# Patient Record
Sex: Male | Born: 1970 | Race: White | Hispanic: No | Marital: Single | Smoking: Current every day smoker
Health system: Southern US, Community
[De-identification: ages and names within clinical notes are randomized; demographics above are authoritative.]

---

## 2019-01-24 ENCOUNTER — Other Ambulatory Visit: Payer: Self-pay

## 2019-01-24 ENCOUNTER — Emergency Department (HOSPITAL_COMMUNITY)
Admission: EM | Admit: 2019-01-24 | Discharge: 2019-01-24 | Disposition: A | Discharge status: Home / Self Care | Payer: Self-pay | Attending: Emergency Medicine | Admitting: Emergency Medicine

## 2019-01-24 DIAGNOSIS — K4090 Unilateral inguinal hernia, without obstruction or gangrene, not specified as recurrent: Secondary | ICD-10-CM | POA: Insufficient documentation

## 2019-01-24 DIAGNOSIS — F1210 Cannabis abuse, uncomplicated: Secondary | ICD-10-CM | POA: Insufficient documentation

## 2019-01-24 NOTE — ED Triage Notes (Addendum)
Pt here with c/o of hernia that was diagnosed two years ago but never sought treatment. Per pt "pain got worse in my ball sac when I jumped off of a step"

## 2019-01-24 NOTE — ED Provider Notes (Addendum)
[facility] EMERGENCY DEPARTMENT Provider Note   CSN: [id number] Arrival date & time: 01/24/19  1640     History   Chief Complaint No chief complaint on file.   HPI Joseph Warren is a 48 y.o. male.     Patient reports his F ing ball sack hurts and it needs to be f ing fixed.   Pt difficult to arouse initially and is angry when he is awoken.  Pt admits to using dope earlier and states he is tired and needs somewhere to piss.  Pt denies over dose.  He denies difficulty breathing.  Pt reports he has had a hernia for years and it needs to be fixed.   The history is provided by the patient. No language interpreter was used.    No past medical history on file.  There are no active problems to display for this patient.         Home Medications    Prior to Admission medications   Not on File    Family History No family history on file.  Social History Social History   Tobacco Use  . Smoking status: Not on file  Substance Use Topics  . Alcohol use: Not on file  . Drug use: Not on file     Allergies   Patient has no allergy information on record.   Review of Systems Review of Systems  Unable to perform ROS: Other  cursing and uncooperative    Physical Exam Updated Vital Signs BP 118/82 (BP Location: Right Arm)   Pulse 80   Temp 98.1 F (36.7 C) (Oral)   Resp 18   SpO2 98%   Physical Exam Vitals signs and nursing note reviewed.  Constitutional:      Appearance: He is well-developed.  HENT:     Head: Normocephalic.  Neck:     Musculoskeletal: Normal range of motion.  Cardiovascular:     Rate and Rhythm: Normal rate.  Pulmonary:     Effort: Pulmonary effort is normal.  Abdominal:     General: Abdomen is flat. There is no distension.     Comments: Easily reducible hernia right inguinal area   Musculoskeletal: Normal range of motion.  Neurological:     Mental Status: He is alert and oriented to person, place, and time.   Psychiatric:     Comments: Angry/abusive       ED Treatments / Results  Labs (all labs ordered are listed, but only abnormal results are displayed) Labs Reviewed - No data to display  EKG None  Radiology No results found.  Procedures Procedures (including critical care time)  Medications Ordered in ED Medications - No data to display   Initial Impression / Assessment and Plan / ED Course  I have reviewed the triage vital signs and the nursing notes.  Pertinent labs & imaging results that were available during my care of the patient were reviewed by me and considered in my medical decision making (see chart for details).        Pt assessed with EMT in the room.  Pt is able to stand and ambulate.  His vital signs are normal.  Pt does not appear to have any acute emergent issues. He does not want tylenol. He reports he gets better drugs off the street. Pt very unpleasant and abusive with staff.  Pt is discharged with a referal to [place] [facility].   Final Clinical Impressions(s) / ED Diagnoses   Final diagnoses:  Hernia, inguinal, right    ED Discharge Orders    None    An After Visit Summary was printed and given to the patient.    Elson Areas, PA-C 01/24/19 1810    Elson Areas, PA-C 01/24/19 [name], MD 01/24/19 1956

## 2019-01-24 NOTE — ED Notes (Signed)
Patient verbalizes understanding of discharge instructions. Opportunity for questioning and answers were provided. Armband removed by staff, pt discharged from ED.  

## 2019-02-11 ENCOUNTER — Emergency Department (HOSPITAL_COMMUNITY)
Admission: EM | Admit: 2019-02-11 | Discharge: 2019-02-11 | Disposition: A | Discharge status: Home / Self Care | Payer: Self-pay | Attending: Emergency Medicine | Admitting: Emergency Medicine

## 2019-02-11 ENCOUNTER — Emergency Department (HOSPITAL_COMMUNITY): Payer: Self-pay

## 2019-02-11 ENCOUNTER — Other Ambulatory Visit: Payer: Self-pay

## 2019-02-11 ENCOUNTER — Encounter (HOSPITAL_COMMUNITY): Payer: Self-pay | Admitting: Emergency Medicine

## 2019-02-11 DIAGNOSIS — Y999 Unspecified external cause status: Secondary | ICD-10-CM | POA: Insufficient documentation

## 2019-02-11 DIAGNOSIS — Z5321 Procedure and treatment not carried out due to patient leaving prior to being seen by health care provider: Secondary | ICD-10-CM | POA: Insufficient documentation

## 2019-02-11 DIAGNOSIS — Y9301 Activity, walking, marching and hiking: Secondary | ICD-10-CM | POA: Insufficient documentation

## 2019-02-11 DIAGNOSIS — F17210 Nicotine dependence, cigarettes, uncomplicated: Secondary | ICD-10-CM | POA: Insufficient documentation

## 2019-02-11 DIAGNOSIS — Y92410 Unspecified street and highway as the place of occurrence of the external cause: Secondary | ICD-10-CM | POA: Insufficient documentation

## 2019-02-11 DIAGNOSIS — S82141A Displaced bicondylar fracture of right tibia, initial encounter for closed fracture: Secondary | ICD-10-CM | POA: Insufficient documentation

## 2019-02-11 MED ORDER — OXYCODONE HCL 5 MG PO TABS: 5.0000 mg | ORAL_TABLET | ORAL | 0 refills | Status: AC | PRN

## 2019-02-11 MED ORDER — OXYCODONE HCL 5 MG PO TABS
5.0000 mg | ORAL_TABLET | Freq: Once | ORAL | Status: AC
  Administered 2019-02-11: 5 mg via ORAL
  Filled 2019-02-11: qty 1
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
## 2019-02-11 NOTE — Discharge Instructions (Addendum)
In discussion with Dr. Stann Mainland, you can bear weight on your leg as tolerated with crutches. We recommend ice, elevation, using ACE wrap.  Follow up with him or member of the group in 2 weeks, call tomorrow AM to set up your appointment.   Take Tylenol 1000 mg 4 times a day for 1 week. This is the maximum dose of Tylenol you can take from all sources. Please check other over-the-counter medications and prescriptions to ensure you are not taking other medications that contain acetaminophen.  You may also take ibuprofen 400 mg 6 times a day alternating with or at the same time as tylenol.  Take oxycodone as needed for breakthrough pain.  This medication can be addicting, sedating and cause constipation.
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
## 2020-02-23 ENCOUNTER — Encounter (HOSPITAL_COMMUNITY): Payer: Self-pay

## 2020-02-23 ENCOUNTER — Emergency Department (HOSPITAL_COMMUNITY)
Admission: EM | Admit: 2020-02-23 | Discharge: 2020-02-23 | Discharge status: Against Medical Advice | Payer: Self-pay | Attending: Emergency Medicine | Admitting: Emergency Medicine

## 2020-02-23 DIAGNOSIS — F17200 Nicotine dependence, unspecified, uncomplicated: Secondary | ICD-10-CM | POA: Insufficient documentation

## 2020-02-23 DIAGNOSIS — T400X1A Poisoning by opium, accidental (unintentional), initial encounter: Secondary | ICD-10-CM | POA: Insufficient documentation

## 2020-02-23 DIAGNOSIS — T40601A Poisoning by unspecified narcotics, accidental (unintentional), initial encounter: Secondary | ICD-10-CM

## 2020-02-23 NOTE — ED Notes (Signed)
Pt left AMA at this time. This RN and Madilyn Hook, MD spoke with pt regarding risks with leaving AMA. Pt verbalizes understanding of all information and states he needs to leave. Pt refusing to sign AMA. Self removed IV. Pt ambulated out of this ED with steady gait, clear speech at this time.
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
